# Patient Record
Sex: Male | Born: 1995 | Race: Black or African American | Hispanic: No | Marital: Single | Smoking: Never smoker
Health system: Southern US, Community
[De-identification: ages and names within clinical notes are randomized; demographics above are authoritative.]

## PROBLEM LIST (undated history)

## (undated) DIAGNOSIS — S82899A Other fracture of unspecified lower leg, initial encounter for closed fracture: Secondary | ICD-10-CM

## (undated) DIAGNOSIS — M25561 Pain in right knee: Secondary | ICD-10-CM

## (undated) HISTORY — PX: APPENDECTOMY: SHX54

---
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
## 2014-01-11 ENCOUNTER — Ambulatory Visit (HOSPITAL_COMMUNITY)
Admission: RE | Admit: 2014-01-11 | Discharge: 2014-01-11 | Disposition: A | Discharge status: Home / Self Care | Payer: Medicaid Other | Attending: Psychiatry | Admitting: Psychiatry

## 2014-01-11 ENCOUNTER — Encounter (HOSPITAL_COMMUNITY): Payer: Self-pay | Admitting: Emergency Medicine

## 2014-01-11 ENCOUNTER — Emergency Department (HOSPITAL_COMMUNITY)
Admission: EM | Admit: 2014-01-11 | Discharge: 2014-01-13 | Disposition: A | Discharge status: Home / Self Care | Payer: Medicaid Other | Attending: Emergency Medicine | Admitting: Emergency Medicine

## 2014-01-11 DIAGNOSIS — R45851 Suicidal ideations: Secondary | ICD-10-CM | POA: Diagnosis present

## 2014-01-11 DIAGNOSIS — F1210 Cannabis abuse, uncomplicated: Secondary | ICD-10-CM | POA: Insufficient documentation

## 2014-01-11 DIAGNOSIS — F329 Major depressive disorder, single episode, unspecified: Secondary | ICD-10-CM | POA: Insufficient documentation

## 2014-01-11 DIAGNOSIS — Z8739 Personal history of other diseases of the musculoskeletal system and connective tissue: Secondary | ICD-10-CM | POA: Diagnosis not present

## 2014-01-11 DIAGNOSIS — J45909 Unspecified asthma, uncomplicated: Secondary | ICD-10-CM | POA: Diagnosis not present

## 2014-01-11 DIAGNOSIS — Z87828 Personal history of other (healed) physical injury and trauma: Secondary | ICD-10-CM | POA: Diagnosis not present

## 2014-01-11 DIAGNOSIS — F39 Unspecified mood [affective] disorder: Secondary | ICD-10-CM

## 2014-01-11 DIAGNOSIS — F32A Depression, unspecified: Secondary | ICD-10-CM

## 2014-01-11 DIAGNOSIS — E049 Nontoxic goiter, unspecified: Secondary | ICD-10-CM | POA: Diagnosis not present

## 2014-01-11 LAB — SALICYLATE LEVEL: Salicylate Lvl: <2 mg/dL — ABNORMAL LOW (ref 2.8–20.0)

## 2014-01-11 LAB — COMPREHENSIVE METABOLIC PANEL
ALT: 20 U/L (ref 0–53)
AST: 29 U/L (ref 0–37)
Albumin: 4.6 g/dL (ref 3.5–5.2)
Alkaline Phosphatase: 85 U/L (ref 39–117)
Anion gap: 15 (ref 5–15)
BILIRUBIN TOTAL: 0.6 mg/dL (ref 0.3–1.2)
BUN: 18 mg/dL (ref 6–23)
CALCIUM: 10.1 mg/dL (ref 8.4–10.5)
CHLORIDE: 107 meq/L (ref 96–112)
CO2: 22 meq/L (ref 19–32)
Creatinine, Ser: 1.03 mg/dL (ref 0.50–1.35)
GFR calc Af Amer: >90 mL/min (ref >90)
Glucose, Bld: 81 mg/dL (ref 70–99)
Potassium: 4.3 mEq/L (ref 3.7–5.3)
SODIUM: 144 meq/L (ref 137–147)
Total Protein: 7.9 g/dL (ref 6.0–8.3)

## 2014-01-11 LAB — CBC WITH DIFFERENTIAL/PLATELET
BASOS ABS: 0 10*3/uL (ref 0.0–0.1)
Basophils Relative: 1 % (ref 0–1)
Eosinophils Absolute: 0.3 10*3/uL (ref 0.0–0.7)
Eosinophils Relative: 5 % (ref 0–5)
HCT: 49.4 % (ref 39.0–52.0)
Hemoglobin: 16.3 g/dL (ref 13.0–17.0)
LYMPHS PCT: 28 % (ref 12–46)
Lymphs Abs: 1.7 10*3/uL (ref 0.7–4.0)
MCH: 28.1 pg (ref 26.0–34.0)
MCHC: 33 g/dL (ref 30.0–36.0)
MCV: 85.2 fL (ref 78.0–100.0)
Monocytes Absolute: 0.6 10*3/uL (ref 0.1–1.0)
Monocytes Relative: 9 % (ref 3–12)
NEUTROS ABS: 3.5 10*3/uL (ref 1.7–7.7)
NEUTROS PCT: 57 % (ref 43–77)
PLATELETS: 201 10*3/uL (ref 150–400)
RBC: 5.8 MIL/uL (ref 4.22–5.81)
RDW: 13.1 % (ref 11.5–15.5)
WBC: 6.2 10*3/uL (ref 4.0–10.5)

## 2014-01-11 LAB — ACETAMINOPHEN LEVEL

## 2014-01-11 LAB — RAPID URINE DRUG SCREEN, HOSP PERFORMED
Amphetamines: NOT DETECTED
BARBITURATES: NOT DETECTED
Benzodiazepines: NOT DETECTED
COCAINE: NOT DETECTED
Opiates: NOT DETECTED
TETRAHYDROCANNABINOL: POSITIVE — AB

## 2014-01-11 LAB — ETHANOL: Alcohol, Ethyl (B): <11 mg/dL (ref 0–11)

## 2014-01-11 MED ORDER — IBUPROFEN 200 MG PO TABS
600.0000 mg | ORAL_TABLET | Freq: Three times a day (TID) | ORAL | Status: DC | PRN
  Administered 2014-01-11 – 2014-01-12 (×2): 600 mg via ORAL
  Filled 2014-01-11 (×2): qty 3

## 2014-01-11 MED ORDER — ZOLPIDEM TARTRATE 5 MG PO TABS: 5.0000 mg | ORAL_TABLET | Freq: Every evening | ORAL | Status: DC | PRN

## 2014-01-11 MED ORDER — ONDANSETRON HCL 4 MG PO TABS: 4.0000 mg | ORAL_TABLET | Freq: Three times a day (TID) | ORAL | Status: DC | PRN

## 2014-01-11 MED ORDER — ALUM & MAG HYDROXIDE-SIMETH 200-200-20 MG/5ML PO SUSP: 30.0000 mL | ORAL | Status: DC | PRN

## 2014-01-11 MED ORDER — ACETAMINOPHEN 325 MG PO TABS: 650.0000 mg | ORAL_TABLET | ORAL | Status: DC | PRN

## 2014-01-11 NOTE — BH Assessment (Signed)
Assessment Note  Peter Solomon is an 18 y.o. African American male that reports hearing voices telling him to kill himself by cutting his wrist or by doing something stupid so the police will kill him.  Patient reports that the voices are telling him to harm others as well.  Patient does not have a plan to harm others.  Patient reports that he first started hearing voices when he was in middle school.  Patient reports that he stopped taking his medication 2 years ago.  Patient reports that he only gets 2-3 hours of sleep at night fir the past 2 months.    Patient reports that he lives with his mother and has positive family supports.  Patient reports that he attends [employer] but he was suspended for 5 days due to instigating a fight at school.  Patient denies prior psychiatric hospitalization.  Patient denies physical, sexual or emotional abuse.  Patient denies substance abuse.      Axis I: Mood Disorder NOS Axis II: Deferred Axis III:  Past Medical History  Diagnosis Date  . Asthma   . Broken ankle     broken right ankle, 18yo  . Right knee pain 12/06/2011   Axis IV: economic problems, occupational problems, other psychosocial or environmental problems, problems related to social environment and problems with primary support group Axis V: 21-30 behavior considerably influenced by delusions or hallucinations OR serious impairment in judgment, communication OR inability to function in almost all areas  Past Medical History:  Past Medical History  Diagnosis Date  . Asthma   . Broken ankle     broken right ankle, 18yo  . Right knee pain 12/06/2011    Past Surgical History  Procedure Laterality Date  . Laparoscopic appendectomy N/A 05/27/2012    Procedure: APPENDECTOMY LAPAROSCOPIC;  Surgeon: Lodema PilotBrian Layton, DO;  Location: [facility];  Service: General;  Laterality: N/A;    Family History:  Family History  Problem Relation Age of Onset  . Diabetes Other   . Hypertension  Other   . Cancer Other   . Diabetes Father   . Hypertension Father   . Seizures Father     Social History:  reports that he has never smoked. He has never used smokeless tobacco. He reports that he uses illicit drugs (Marijuana) about 4 times per week. He reports that he does not drink alcohol.  Additional Social History:     CIWA:   COWS:    Allergies: No Known Allergies  Home Medications:  (Not in a hospital admission)  OB/GYN Status:  No LMP for male patient.  General Assessment Data [facility] of Assessment[facility] Is this a Tele or Face-to-Face Assessment?: Face-to-Face Is this an Initial Assessment or a Re-assessment for this encounter?: Initial Assessment Living Arrangements: Parent Can pt return to current living arrangement?: Yes Admission Status: Voluntary Is patient capable of signing voluntary admission?: Yes Transfer from: Home Referral Source: Self/Family/Friend  Medical Screening Exam [facility] Walk-[facility] ONLY) Medical Exam completed: Yes  [facility] Living Arrangements: Parent Name of Psychiatrist: None Reported Name of Therapist: None Reported  Education Status Is patient currently in school?: No Current Grade: NA Highest grade of school patient has completed: NA Name of school: NA Contact person: NA  Risk to self with the past 6 months Suicidal Ideation: Yes-Currently Present Suicidal Intent: Yes-Currently Present Is patient at risk for suicide?: Yes Suicidal Plan?: Yes-Currently Present Specify Current Suicidal Plan: Cut his  wrist or do something stupid to cause the police to kill him. Access to Means: Yes Specify Access to Suicidal Means: Anything sharp What has been your use of drugs/alcohol within the last 12 months?: None Reported Previous Attempts/Gestures: No How many times?: 0 Other Self Harm Risks: None Reported Triggers for Past Attempts: Unpredictable, Other (Comment) (Hearing voices telling him  to hurt himself ) Intentional Self Injurious Behavior: None Family Suicide History: No Recent stressful life event(s): Job Loss, Financial Problems Persecutory voices/beliefs?: Yes Depression: Yes Depression Symptoms: Despondent, Insomnia, Tearfulness, Isolating, Fatigue, Guilt, Loss of interest in usual pleasures, Feeling worthless/self pity, Feeling angry/irritable Substance abuse history and/or treatment for substance abuse?: No Suicide prevention information given to non-admitted patients: Yes  Risk to Others within the past 6 months Homicidal Ideation: Yes-Currently Present Thoughts of Harm to Others: Yes-Currently Present Comment - Thoughts of Harm to Others: Pt reports that he is angry and wants to hurt other (Pt deinies having a specific person that he wants to harm.) Current Homicidal Intent: Yes-Currently Present Current Homicidal Plan: Yes-Currently Present Describe Current Homicidal Plan: Death by suicide Access to Homicidal Means: Yes Describe Access to Homicidal Means: Causing the police to shot him. Identified Victim: None Reported History of harm to others?: No Assessment of Violence: None Noted Violent Behavior Description: None Reported Does patient have access to weapons?: No Criminal Charges Pending?: No Does patient have a court date: No  Psychosis Hallucinations: None noted Delusions: None noted  Mental Status Report Appear/Hygiene: Disheveled Eye Contact: Fair Motor Activity: Freedom of movement Speech: Logical/coherent Level of Consciousness: Alert Mood: Depressed, Anxious Affect: Anxious, Depressed, Blunted Anxiety Level: Moderate Thought Processes: Coherent, Relevant Judgement: Unimpaired Orientation: Person, Place, Time, Situation Obsessive Compulsive Thoughts/Behaviors: None  Cognitive Functioning Concentration: Decreased Memory: Recent Intact, Remote Intact IQ: Average Insight: Poor Impulse Control: Fair Appetite: Fair Weight Loss:  0 Weight Gain: 0 Sleep: Decreased Total Hours of Sleep: 3 Vegetative Symptoms: Decreased grooming  ADLScreening [facility]) Patient's cognitive ability adequate to safely complete daily activities?: Yes Patient able to express need for assistance with ADLs?: Yes Independently performs ADLs?: Yes (appropriate for developmental age)  Prior Inpatient Therapy Prior Inpatient Therapy: No Prior Therapy Dates: NA Prior Therapy Facilty/Provider(s): NA Reason for Treatment: NA  Prior Outpatient Therapy Prior Outpatient Therapy: Yes Prior Therapy Dates: 2013    Prior Therapy Facilty/Provider(s): Pt does not remember the names of the facilities Reason for Treatment: Medication Management  ADL Screening (condition at time of admission) Patient's cognitive ability adequate to safely complete daily activities?: Yes Patient able to express need for assistance with ADLs?: Yes Independently performs ADLs?: Yes (appropriate for developmental age)                  Additional Information 1:1 In Past 12 Months?: No CIRT Risk: No Elopement Risk: No Does patient have medical clearance?: No     Disposition:  Disposition Initial Assessment Completed for this Encounter: Yes Disposition of Patient: Inpatient treatment program Type of inpatient treatment program: Adult (Per Lloyd HugerNeil patient meets criteria for inpatient hospitalizatio)  On Site Evaluation by:   Reviewed with Physician:    Phillip HealStevenson, Monisha Siebel [place] 01/11/2014 1:33 PM

## 2014-01-11 NOTE — ED Notes (Signed)
Pt transferred from E.R to SAPU. Room 42. C/o auditory hall and feeling severely depressed.Reports voices telling him that he's no good.Pt has contracted for safety.

## 2014-01-11 NOTE — ED Notes (Signed)
Pt and belongings have been wanded, belongings at nursing station and will be handed off to secured hold staff when moved. Belongings include a pair of black slippers with fur like insole, and blue sweat shorts, as well as a  Marketing executiveBlack t-shirt.

## 2014-01-11 NOTE — ED Notes (Signed)
PA at bedside.

## 2014-01-11 NOTE — ED Notes (Signed)
Pt states he has had problems with depression for much of his life, this morning he thought about killing himself by slitting his wrists with a knife. Pt denies ever attempting suicide.

## 2014-01-11 NOTE — ED Notes (Addendum)
Report received from Peter Nancyonna Perez RN. Pt. Alert and oriented in no distress denies SI, HI, VH and pain. Pt. Says he is hearing voices that are telling him to stay at home. Will continue to monitor for safety. Pt. Instructed to come to me with problems or concerns. Q 15 minute checks continue.

## 2014-01-11 NOTE — BH Assessment (Signed)
Per Lloyd HugerNeil, [place] patient meets criteria for inpatient criteria.  [facility] In[facility] Fermoina[facility]  CSW Tyler AasDoris will refer patient to other facilities.  Writer informed the Press photographerCharge Nurse Misty Stanley(Stacey) and the TTS Therapist [facility]

## 2014-01-11 NOTE — ED Notes (Signed)
Bed: [facility] Expected date:  Expected time:  Means of arrival:  Comments: [facility]-Donaghue, SI

## 2014-01-11 NOTE — ED Provider Notes (Signed)
CSN: [id number]     Arrival date & time 01/11/14  1135 History   First MD Initiated Contact with Patient 01/11/14 1149     No chief complaint on file.    (Consider location/radiation/quality/duration/timing/severity/associated sxs/prior Treatment) HPI   18 year old male presents to ED requesting for medical clearance for suicidal ideation. Patient states he has a known family history of psychiatric disease and has been having depression and suicidal ideation at an early age. For the past 6 months he has been having increased depression, hearing voices telling him to harm himself and has persistent thought of killing himself. He also reported having plans such as cutting his wrist however he would like to seek for help with his psychiatric disease.  Pt requesting for help.  Patient states he has intermittent thought of homicidal ideation but without a specific plan or specific person. He denies self medicating with either alcohol or drugs. He is a nonsmoker. He denies any recent changes in environment or medication. He does not have a primary care doctor and does not take any psychiatric medication. He endorses a chronic headache. He denies having chest pain shortness of breath abdominal pain back pain numbness or weakness.patient lives with his mom, and states that both mom and sister has psychiatric disease.  Past Medical History  Diagnosis Date  . Asthma   . Broken ankle     broken right ankle, 18yo  . Right knee pain 12/06/2011   Past Surgical History  Procedure Laterality Date  . Laparoscopic appendectomy N/A 05/27/2012    Procedure: APPENDECTOMY LAPAROSCOPIC;  Surgeon: Lodema PilotBrian Layton, DO;  Location: [facility];  Service: General;  Laterality: N/A;   Family History  Problem Relation Age of Onset  . Diabetes Other   . Hypertension Other   . Cancer Other   . Diabetes Father   . Hypertension Father   . Seizures Father    History  Substance Use Topics  . Smoking status: Never Smoker   .  Smokeless tobacco: Never Used  . Alcohol Use: No    Review of Systems  All other systems reviewed and are negative.     Allergies  Review of patient's allergies indicates no known allergies.  Home Medications   Prior to Admission medications   Medication Sig Start Date End Date Taking? Authorizing Provider  albuterol (PROVENTIL HFA;VENTOLIN HFA) 108 (90 BASE) MCG/ACT inhaler Inhale 2 puffs into the lungs every 6 (six) hours as needed. For wheezing    Historical Provider, MD  Fluticasone-Salmeterol (ADVAIR) 250-50 MCG/DOSE AEPB Inhale 1 puff into the lungs every 12 (twelve) hours.    Historical Provider, MD  HYDROcodone-acetaminophen (NORCO/VICODIN) 5-325 MG per tablet Take 1-2 tablets by mouth every 4 (four) hours as needed for pain. 05/28/12   Claud KelpHaywood Ingram, MD   There were no vitals taken for this visit. Physical Exam  Constitutional: He is oriented to person, place, and time. He appears well-developed and well-nourished. No distress.  HENT:  Head: Atraumatic.  Eyes: Conjunctivae are normal.  Neck: Normal range of motion. Neck supple.  Goiter noted  Cardiovascular: Normal rate and regular rhythm.  Exam reveals no gallop and no friction rub.   No murmur heard. Pulmonary/Chest: Effort normal and breath sounds normal. No respiratory distress. He exhibits no tenderness.  Abdominal: Soft. There is no tenderness.  Neurological: He is alert and oriented to person, place, and time. GCS eye subscore is 4. GCS verbal subscore is 5. GCS motor subscore is 6.  Skin: No rash noted.  Psychiatric: He has a normal mood and affect. His speech is normal and behavior is normal. Thought content is not paranoid. He expresses suicidal ideation. He expresses no homicidal ideation.    ED Course  Procedures (including critical care time)  12:04 PM Patient reported long-standing depression and persistent suicidal ideation, currently requesting for psychiatric help. Patient is in no acute distress at  this time. Workup initiated.  2:14 PM Pt is medically cleared.  Psych hold order placed.  [facility] does not appear to have room for him at this time.  Will consult TTS.    Labs Review Labs Reviewed  URINE RAPID DRUG SCREEN (HOSP PERFORMED) - Abnormal; Notable for the following:    Tetrahydrocannabinol POSITIVE (*)    All other components within normal limits  SALICYLATE LEVEL - Abnormal; Notable for the following:    Salicylate Lvl <2.0 (*)    All other components within normal limits  CBC WITH DIFFERENTIAL  COMPREHENSIVE METABOLIC PANEL  ETHANOL  ACETAMINOPHEN LEVEL    Imaging Review No results found.   EKG Interpretation None      MDM   Final diagnoses:  Depression  Suicidal ideation    BP 121/68 mmHg  Pulse 58  Temp(Src) 98 F (36.7 C) (Oral)  Resp 16  SpO2 99%  I have reviewed nursing notes and vital signs. I reviewed available ER/hospitalization records thought the EMR     Fayrene HelperBowie Jackqueline Aquilar, PA-C 01/11/14 1604  Mirian MoMatthew Gentry, MD 01/12/14 [phone]

## 2014-01-12 DIAGNOSIS — R45851 Suicidal ideations: Secondary | ICD-10-CM

## 2014-01-12 DIAGNOSIS — R45850 Homicidal ideations: Secondary | ICD-10-CM

## 2014-01-12 DIAGNOSIS — F39 Unspecified mood [affective] disorder: Secondary | ICD-10-CM

## 2014-01-12 MED ORDER — RISPERIDONE 1 MG PO TABS
1.0000 mg | ORAL_TABLET | Freq: Two times a day (BID) | ORAL | Status: DC
  Administered 2014-01-12 – 2014-01-13 (×3): 1 mg via ORAL
  Filled 2014-01-12 (×3): qty 1

## 2014-01-12 NOTE — Consult Note (Signed)
[facility] Face-to-Face Psychiatry Consult   Reason for Consult:  "depressed and angry" Referring Physician:  EDP  Peter Solomon is an 18 y.o. male. Total Time spent with patient: 30 minutes  Assessment: AXIS I:  Mood Disorder NOS AXIS II:  Deferred AXIS III:   Past Medical History  Diagnosis Date  . Asthma   . Broken ankle     broken right ankle, 18yo  . Right knee pain 12/06/2011   AXIS IV:  other psychosocial or environmental problems AXIS V:  21-30 behavior considerably influenced by delusions or hallucinations OR serious impairment in judgment, communication OR inability to function in almost all areas  Plan:  Recommend psychiatric Inpatient admission when medically cleared.  Subjective:   Peter Solomon is a 18 y.o. male patient admitted reporting hearing voices telling him to kill himself by cutting his wrist or by doing something stupid so the police will kill him. Patient reports that the voices are telling him to harm others as well. Patient does not have a plan to harm others. Patient reports that he first started hearing voices when he was in middle school. Patient reports that he stopped taking his medication 2 years ago. Patient reports that he only gets 2-3 hours of sleep at night for the past 2 months.His mood has been depressed and angry. No prior suicide attempts. No previous psych meds.   Patient reports that he lives with his mother and has positive family supports. Patient reports that he attends [employer] but he was suspended for 5 days due to instigating a fight at school.  Patient denies prior psychiatric hospitalization. Patient denies physical, sexual or emotional abuse. Patient denies substance abuse. Marland Kitchen  HPI:  See above HPI Elements:   Location:  severe. Quality:  depressed. Severity:  severe. Timing:  recent. Duration:  recent. Context:  severe.  Past Psychiatric History: Past Medical History  Diagnosis Date  . Asthma   . Broken  ankle     broken right ankle, 18yo  . Right knee pain 12/06/2011    reports that he has never smoked. He has never used smokeless tobacco. He reports that he uses illicit drugs (Marijuana) about 4 times per week. He reports that he does not drink alcohol. Family History  Problem Relation Age of Onset  . Diabetes Other   . Hypertension Other   . Cancer Other   . Diabetes Father   . Hypertension Father   . Seizures Father            Allergies:  No Known Allergies  ACT Assessment Complete:  Yes:    Educational Status    Risk to Self: Risk to self with the past 6 months Is patient at risk for suicide?: Yes Substance abuse history and/or treatment for substance abuse?: No  Risk to Others:    Abuse:    Prior Inpatient Therapy:    Prior Outpatient Therapy:    Additional Information:                    Objective: Blood pressure 131/63, pulse 60, temperature 97.6 F (36.4 C), temperature source Oral, resp. rate 16, SpO2 100 %.There is no height or weight on file to calculate BMI. Results for orders placed or performed during the hospital encounter of 01/11/14 (from the past 72 hour(s))  CBC with Differential     Status: None   Collection Time: 01/11/14 12:13 PM  Result Value Ref Range   WBC 6.2 4.0 -  10.5 K/uL   RBC 5.80 4.22 - 5.81 MIL/uL   Hemoglobin 16.3 13.0 - 17.0 g/dL   HCT 49.4 39.0 - 52.0 %   MCV 85.2 78.0 - 100.0 fL   MCH 28.1 26.0 - 34.0 pg   MCHC 33.0 30.0 - 36.0 g/dL   RDW 13.1 11.5 - 15.5 %   Platelets 201 150 - 400 K/uL   Neutrophils Relative % 57 43 - 77 %   Neutro Abs 3.5 1.7 - 7.7 K/uL   Lymphocytes Relative 28 12 - 46 %   Lymphs Abs 1.7 0.7 - 4.0 K/uL   Monocytes Relative 9 3 - 12 %   Monocytes Absolute 0.6 0.1 - 1.0 K/uL   Eosinophils Relative 5 0 - 5 %   Eosinophils Absolute 0.3 0.0 - 0.7 K/uL   Basophils Relative 1 0 - 1 %   Basophils Absolute 0.0 0.0 - 0.1 K/uL  Comprehensive metabolic panel     Status: None   Collection Time:  01/11/14 12:13 PM  Result Value Ref Range   Sodium 144 137 - 147 mEq/L   Potassium 4.3 3.7 - 5.3 mEq/L   Chloride 107 96 - 112 mEq/L   CO2 22 19 - 32 mEq/L   Glucose, Bld 81 70 - 99 mg/dL   BUN 18 6 - 23 mg/dL   Creatinine, Ser 1.03 0.50 - 1.35 mg/dL   Calcium 10.1 8.4 - 10.5 mg/dL   Total Protein 7.9 6.0 - 8.3 g/dL   Albumin 4.6 3.5 - 5.2 g/dL   AST 29 0 - 37 U/L   ALT 20 0 - 53 U/L   Alkaline Phosphatase 85 39 - 117 U/L   Total Bilirubin 0.6 0.3 - 1.2 mg/dL   GFR calc non Af Amer >90 >90 mL/min   GFR calc Af Amer >90 >90 mL/min    Comment: (NOTE) The eGFR has been calculated using the CKD EPI equation. This calculation has not been validated in all clinical situations. eGFR's persistently <90 mL/min signify possible Chronic Kidney Disease.    Anion gap 15 5 - 15  Ethanol     Status: None   Collection Time: 01/11/14 12:13 PM  Result Value Ref Range   Alcohol, Ethyl (B) <11 0 - 11 mg/dL    Comment:        LOWEST DETECTABLE LIMIT FOR SERUM ALCOHOL IS 11 mg/dL FOR MEDICAL PURPOSES ONLY   Salicylate level     Status: Abnormal   Collection Time: 01/11/14 12:13 PM  Result Value Ref Range   Salicylate Lvl <7.0 (L) 2.8 - 20.0 mg/dL  Acetaminophen level     Status: None   Collection Time: 01/11/14 12:13 PM  Result Value Ref Range   Acetaminophen (Tylenol), Serum <15.0 10 - 30 ug/mL    Comment:        THERAPEUTIC CONCENTRATIONS VARY SIGNIFICANTLY. A RANGE OF 10-30 ug/mL MAY BE AN EFFECTIVE CONCENTRATION FOR MANY PATIENTS. HOWEVER, SOME ARE BEST TREATED AT CONCENTRATIONS OUTSIDE THIS RANGE. ACETAMINOPHEN CONCENTRATIONS >150 ug/mL AT 4 HOURS AFTER INGESTION AND >50 ug/mL AT 12 HOURS AFTER INGESTION ARE OFTEN ASSOCIATED WITH TOXIC REACTIONS.   Urine rapid drug screen (hosp performed)     Status: Abnormal   Collection Time: 01/11/14 12:57 PM  Result Value Ref Range   Opiates NONE DETECTED NONE DETECTED   Cocaine NONE DETECTED NONE DETECTED   Benzodiazepines NONE  DETECTED NONE DETECTED   Amphetamines NONE DETECTED NONE DETECTED   Tetrahydrocannabinol POSITIVE (A) NONE DETECTED  Barbiturates NONE DETECTED NONE DETECTED    Comment:        DRUG SCREEN FOR MEDICAL PURPOSES ONLY.  IF CONFIRMATION IS NEEDED FOR ANY PURPOSE, NOTIFY LAB WITHIN 5 DAYS.        LOWEST DETECTABLE LIMITS FOR URINE DRUG SCREEN Drug Class       Cutoff (ng/mL) Amphetamine      1000 Barbiturate      200 Benzodiazepine   540 Tricyclics       981 Opiates          300 Cocaine          300 THC              50    Labs are reviewed and are pertinent for THC positive.  Current Facility-Administered Medications  Medication Dose Route Frequency Provider Last Rate Last Dose  . acetaminophen (TYLENOL) tablet 650 mg  650 mg Oral Q4H PRN Domenic Moras, PA-C      . alum & mag hydroxide-simeth (MAALOX/MYLANTA) 200-200-20 MG/5ML suspension 30 mL  30 mL Oral PRN Domenic Moras, PA-C      . ibuprofen (ADVIL,MOTRIN) tablet 600 mg  600 mg Oral Q8H PRN Domenic Moras, PA-C   600 mg at 01/11/14 2216  . ondansetron (ZOFRAN) tablet 4 mg  4 mg Oral Q8H PRN Domenic Moras, PA-C      . zolpidem (AMBIEN) tablet 5 mg  5 mg Oral QHS PRN Domenic Moras, PA-C       Current Outpatient Prescriptions  Medication Sig Dispense Refill  . albuterol (PROVENTIL HFA;VENTOLIN HFA) 108 (90 BASE) MCG/ACT inhaler Inhale 2 puffs into the lungs every 6 (six) hours as needed. For wheezing    . albuterol (PROVENTIL) (2.5 MG/3ML) 0.083% nebulizer solution Take 5 mg by nebulization every 6 (six) hours as needed for wheezing or shortness of breath.    Marland Kitchen ibuprofen (ADVIL,MOTRIN) 200 MG tablet Take 200 mg by mouth every 6 (six) hours as needed.    . Fluticasone-Salmeterol (ADVAIR) 250-50 MCG/DOSE AEPB Inhale 1 puff into the lungs every 12 (twelve) hours.    Marland Kitchen HYDROcodone-acetaminophen (NORCO/VICODIN) 5-325 MG per tablet Take 1-2 tablets by mouth every 4 (four) hours as needed for pain. 30 tablet 1    Psychiatric Specialty Exam: Physical  Exam  ROS  Blood pressure 131/63, pulse 60, temperature 97.6 F (36.4 C), temperature source Oral, resp. rate 16, SpO2 100 %.There is no height or weight on file to calculate BMI.  General Appearance: Disheveled and Guarded  Eye Contact::  Minimal  Speech:  Slow  Volume:  Decreased  Mood:  Depressed  Affect:  Depressed  Thought Process:  Goal Directed  Orientation:  Full (Time, Place, and Person)  Thought Content:  Hallucinations: Auditory Command:  to kill self by cutting  Suicidal Thoughts:  Yes.  with intent/plan  Homicidal Thoughts:  Yes.  without intent/plan  Memory:  Negative  Judgement:  Poor  Insight:  Shallow  Psychomotor Activity:  Decreased  Concentration:  Poor  Recall:  Poor  Fund of Knowledge:Fair  Language: Fair  Akathisia:  Negative  Handed:  Right  AIMS (if indicated):     Assets:  Desire for Improvement Physical Health Social Support  Sleep:      Musculoskeletal: Strength & Muscle Tone: within normal limits Gait & Station: normal Patient leans: N/A  Treatment Plan Summary: Daily contact with patient to assess and evaluate symptoms and progress in treatment Medication management Pursue inpatient bed for safety/stabilization.  Dereck Leep  01/12/2014 1:17 PM

## 2014-01-12 NOTE — ED Notes (Signed)
Up to the bathroom 

## 2014-01-12 NOTE — ED Notes (Signed)
In activity room watching tv, c/o of HA.  Pt reports that the voices are gone (has a ha w/ the voices).  Pt encouraged to return to room and dim tthe lights, medicated.  Pt reports some relief w/ decreased lighting.

## 2014-01-12 NOTE — ED Notes (Signed)
Up on the phone 

## 2014-01-12 NOTE — ED Notes (Signed)
Dr sangra and jamison into see 

## 2014-01-12 NOTE — ED Notes (Signed)
Report received from Janie Rambo RN. Pt. Alert and oriented in no distress denies SI, HI, AVH and pain. Will continue to monitor for safety. Pt. Instructed to come to me with problems or concerns. Q 15 minute checks continue. 

## 2014-01-12 NOTE — BH Assessment (Signed)
Binnie RailJoann Glover, [facility], adult unit is at capacity. Contacted the following facilities for placement:  BED AVAILABLE, PT IS UNDER REVIEW: [facility], per [facility], per [facility], per [facility], per [facility]  AT CAPACITY: [facility], per [facility], per Earl LitesJennifer [facility], per [facility], per [facility], per [facility], per [facility], per [facility], per [facility], per [facility], per [facility] [place], per [facility], per Leticia PennaLarry [facility], per [facility], per [facility], per Aggie Cosierarl  NO RESPONSE: [facility]   [address]Detrice Cales Ellis Patsy BaltimoreWarrick Jr, WisconsinLPC, [facility] Triage Specialist [phone]

## 2014-01-13 NOTE — ED Notes (Signed)
Contact information given to pt for his mother.  Pt is aware that he will be transfering today

## 2014-01-13 NOTE — ED Notes (Signed)
Up to the bathroom 

## 2014-01-13 NOTE — ED Notes (Addendum)
Pt ambulatory w/o difficulty w/  sheriff [facility] to [facility].  Belongings given to sheriff.   MAR, transfer report, face sheet, assessment notes, EMTELA, and IVC papers sent w/ pt.  Pt encouraged to contact his mother when he arrives.  Will notify [facility] that pt has been transported

## 2014-01-13 NOTE — BH Assessment (Signed)
[facility]  Per Tammy, [facility]Kean [facility] rm # [id number].  Call report # is [phone].  Pt will be transported by law enforcement after pt is IVC'd by Dr. Tawni CarnesSaranga.

## 2014-01-13 NOTE — ED Notes (Signed)
RN unable to take report and  will call back

## 2014-01-13 NOTE — Progress Notes (Signed)
3:00pm. CSW faxed [facility] of [facility].

## 2014-01-13 NOTE — ED Notes (Signed)
Message left for sheriff for transport 

## 2014-02-13 ENCOUNTER — Encounter (HOSPITAL_COMMUNITY): Payer: Self-pay

## 2014-02-13 ENCOUNTER — Emergency Department (HOSPITAL_COMMUNITY)
Admission: EM | Admit: 2014-02-13 | Discharge: 2014-02-13 | Discharge status: Against Medical Advice | Payer: Medicaid Other | Attending: Emergency Medicine | Admitting: Emergency Medicine

## 2014-02-13 DIAGNOSIS — R1110 Vomiting, unspecified: Secondary | ICD-10-CM | POA: Insufficient documentation

## 2014-02-13 DIAGNOSIS — R197 Diarrhea, unspecified: Secondary | ICD-10-CM | POA: Diagnosis not present

## 2014-02-13 DIAGNOSIS — G43909 Migraine, unspecified, not intractable, without status migrainosus: Secondary | ICD-10-CM | POA: Insufficient documentation

## 2014-02-13 LAB — CBC WITH DIFFERENTIAL/PLATELET
BASOS ABS: 0 10*3/uL (ref 0.0–0.1)
Basophils Relative: 1 % (ref 0–1)
EOS PCT: 6 % — AB (ref 0–5)
Eosinophils Absolute: 0.2 10*3/uL (ref 0.0–0.7)
HCT: 42.6 % (ref 39.0–52.0)
Hemoglobin: 14 g/dL (ref 13.0–17.0)
LYMPHS PCT: 44 % (ref 12–46)
Lymphs Abs: 1.9 10*3/uL (ref 0.7–4.0)
MCH: 27.8 pg (ref 26.0–34.0)
MCHC: 32.9 g/dL (ref 30.0–36.0)
MCV: 84.5 fL (ref 78.0–100.0)
Monocytes Absolute: 0.3 10*3/uL (ref 0.1–1.0)
Monocytes Relative: 8 % (ref 3–12)
NEUTROS ABS: 1.7 10*3/uL (ref 1.7–7.7)
NEUTROS PCT: 41 % — AB (ref 43–77)
PLATELETS: 211 10*3/uL (ref 150–400)
RBC: 5.04 MIL/uL (ref 4.22–5.81)
RDW: 13.1 % (ref 11.5–15.5)
WBC: 4.2 10*3/uL (ref 4.0–10.5)

## 2014-02-13 LAB — COMPREHENSIVE METABOLIC PANEL
ALK PHOS: 75 U/L (ref 39–117)
ALT: 64 U/L — ABNORMAL HIGH (ref 0–53)
AST: 32 U/L (ref 0–37)
Albumin: 4.2 g/dL (ref 3.5–5.2)
Anion gap: 13 (ref 5–15)
BUN: 13 mg/dL (ref 6–23)
CALCIUM: 9.5 mg/dL (ref 8.4–10.5)
CO2: 24 mEq/L (ref 19–32)
Chloride: 103 mEq/L (ref 96–112)
Creatinine, Ser: 1.07 mg/dL (ref 0.50–1.35)
GFR calc Af Amer: >90 mL/min (ref >90)
GFR calc non Af Amer: >90 mL/min (ref >90)
Glucose, Bld: 119 mg/dL — ABNORMAL HIGH (ref 70–99)
POTASSIUM: 4 meq/L (ref 3.7–5.3)
Sodium: 140 mEq/L (ref 137–147)
TOTAL PROTEIN: 7.2 g/dL (ref 6.0–8.3)
Total Bilirubin: 0.3 mg/dL (ref 0.3–1.2)

## 2014-02-13 NOTE — ED Notes (Signed)
Charge called for pt, second attempt, called outside

## 2014-02-13 NOTE — ED Notes (Signed)
Called from lobby but patient did not arise

## 2014-02-13 NOTE — ED Notes (Signed)
Pt presents with c/o migraine, diarrhea, and emesis. Pt reports he has been having migraines ever since he suffered a head injury approx one year ago. Pt reports the diarrhea and vomiting started approx 2 weeks ago. Pt reports he has thrown up twice today and has had diarrhea all day.

## 2014-04-22 ENCOUNTER — Encounter (HOSPITAL_COMMUNITY): Payer: Self-pay | Admitting: Adult Health

## 2014-04-22 ENCOUNTER — Emergency Department (HOSPITAL_COMMUNITY)
Admission: EM | Admit: 2014-04-22 | Discharge: 2014-04-22 | Disposition: A | Discharge status: Home / Self Care | Payer: Medicaid Other | Attending: Emergency Medicine | Admitting: Emergency Medicine

## 2014-04-22 DIAGNOSIS — Z7951 Long term (current) use of inhaled steroids: Secondary | ICD-10-CM | POA: Diagnosis not present

## 2014-04-22 DIAGNOSIS — Z8619 Personal history of other infectious and parasitic diseases: Secondary | ICD-10-CM | POA: Insufficient documentation

## 2014-04-22 DIAGNOSIS — Z202 Contact with and (suspected) exposure to infections with a predominantly sexual mode of transmission: Secondary | ICD-10-CM | POA: Insufficient documentation

## 2014-04-22 DIAGNOSIS — Z87828 Personal history of other (healed) physical injury and trauma: Secondary | ICD-10-CM | POA: Diagnosis not present

## 2014-04-22 DIAGNOSIS — J45909 Unspecified asthma, uncomplicated: Secondary | ICD-10-CM | POA: Diagnosis not present

## 2014-04-22 MED ORDER — CEFTRIAXONE SODIUM 250 MG IJ SOLR
250.0000 mg | Freq: Once | INTRAMUSCULAR | Status: AC
  Administered 2014-04-22: 250 mg via INTRAMUSCULAR
  Filled 2014-04-22: qty 250

## 2014-04-22 MED ORDER — AZITHROMYCIN 250 MG PO TABS
1000.0000 mg | ORAL_TABLET | Freq: Once | ORAL | Status: AC
  Administered 2014-04-22: 1000 mg via ORAL
  Filled 2014-04-22: qty 4

## 2014-04-22 NOTE — ED Notes (Signed)
Requesting STD check-partner has chlamydia-denies symptoms.

## 2014-04-22 NOTE — ED Provider Notes (Signed)
CSN: [id number]     Arrival date & time 04/22/14  1853 History   First MD Initiated Contact with Patient 04/22/14 1859     Chief Complaint  Patient presents with  . Exposure to STD     (Consider location/radiation/quality/duration/timing/severity/associated sxs/prior Treatment) The history is provided by the patient and medical records.     This is a 19 year old male with past medical history significant for asthma, presenting to the ED requesting STD check. Patient states he was informed earlier this morning that his male sexual partner tested positive for chlamydia. He has no current symptoms, specifically no dysuria, urinary frequency, urethral discharge, or abdominal pain. No fevers, chills, or rash. Patient has prior history of chlamydia several years ago, he was asymptomatic at that time as well.  Past Medical History  Diagnosis Date  . Asthma   . Broken ankle     broken right ankle, 19yo  . Right knee pain 12/06/2011   Past Surgical History  Procedure Laterality Date  . Laparoscopic appendectomy N/A 05/27/2012    Procedure: APPENDECTOMY LAPAROSCOPIC;  Surgeon: Lodema PilotBrian Layton, DO;  Location: [facility];  Service: General;  Laterality: N/A;   Family History  Problem Relation Age of Onset  . Diabetes Other   . Hypertension Other   . Cancer Other   . Diabetes Father   . Hypertension Father   . Seizures Father    History  Substance Use Topics  . Smoking status: Never Smoker   . Smokeless tobacco: Never Used  . Alcohol Use: No    Review of Systems  Genitourinary:       STD check  All other systems reviewed and are negative.     Allergies  Review of patient's allergies indicates no known allergies.  Home Medications   Prior to Admission medications   Medication Sig Start Date End Date Taking? Authorizing Provider  albuterol (PROVENTIL HFA;VENTOLIN HFA) 108 (90 BASE) MCG/ACT inhaler Inhale 2 puffs into the lungs every 6 (six) hours as needed. For wheezing     Historical Provider, MD  albuterol (PROVENTIL) (2.5 MG/3ML) 0.083% nebulizer solution Take 5 mg by nebulization every 6 (six) hours as needed for wheezing or shortness of breath.    Historical Provider, MD  Fluticasone-Salmeterol (ADVAIR) 250-50 MCG/DOSE AEPB Inhale 1 puff into the lungs every 12 (twelve) hours.    Historical Provider, MD  HYDROcodone-acetaminophen (NORCO/VICODIN) 5-325 MG per tablet Take 1-2 tablets by mouth every 4 (four) hours as needed for pain. 05/28/12   Claud KelpHaywood Ingram, MD  ibuprofen (ADVIL,MOTRIN) 200 MG tablet Take 200 mg by mouth every 6 (six) hours as needed.    Historical Provider, MD   BP 94/76 mmHg  Pulse 79  Temp(Src) 97.7 F (36.5 C) (Oral)  Resp 17  SpO2 100%   Physical Exam  Constitutional: He is oriented to person, place, and time. He appears well-developed and well-nourished.  HENT:  Head: Normocephalic and atraumatic.  Mouth/Throat: Oropharynx is clear and moist.  Eyes: Conjunctivae and EOM are normal. Pupils are equal, round, and reactive to light.  Neck: Normal range of motion.  Cardiovascular: Normal rate, regular rhythm and normal heart sounds.   Pulmonary/Chest: Effort normal and breath sounds normal. No respiratory distress. He has no wheezes.  Abdominal: Soft. Bowel sounds are normal. There is no tenderness. There is no guarding.  Musculoskeletal: Normal range of motion.  Neurological: He is alert and oriented to person, place, and time.  Skin: Skin is warm and dry.  Psychiatric: He has  a normal mood and affect.  Nursing note and vitals reviewed.   ED Course  Procedures (including critical care time) Labs Review Labs Reviewed  GC/CHLAMYDIA PROBE AMP ([place])    Imaging Review No results found.   EKG Interpretation None      MDM   Final diagnoses:  STD exposure   19 year old male with STD exposure. He is currently asymptomatic, vital signs are stable.  Gc/Chl swab pending.  Patient treated empirically with Rocephin and  azithromycin in the ED. He was instructed that he will be notified of 48-72 hours if culture results are positive.  He is to follow-up with his primary care physician.  Discussed plan with patient, he/she acknowledged understanding and agreed with plan of care.  Return precautions given for new or worsening symptoms.  Garlon Hatchet, PA-C 04/22/14 1927  Joya Gaskins, MD 04/22/14 [phone]

## 2014-04-22 NOTE — Discharge Instructions (Signed)
You have been treated for gonorrhea/chlamydia. Your cultures will come back in 48-72 hours.  You will be notified if positive. Follow-up with your primary care physician. Return to the ED for new concerns.

## 2014-04-24 ENCOUNTER — Telehealth (HOSPITAL_BASED_OUTPATIENT_CLINIC_OR_DEPARTMENT_OTHER): Payer: Self-pay | Admitting: Emergency Medicine

## 2014-04-24 LAB — GC/CHLAMYDIA PROBE AMP (~~LOC~~) NOT AT ARMC
Chlamydia: POSITIVE — AB
Neisseria Gonorrhea: NEGATIVE

## 2014-04-24 NOTE — Telephone Encounter (Signed)
Positive Chlamydia culture Treated with Rocephin and Zithromax per protocol MD [facility] faxed.  Attempt to contact patient by phone. # provided in epic not a working number. Letter sent.

## 2014-06-04 ENCOUNTER — Telehealth (HOSPITAL_COMMUNITY): Payer: Self-pay

## 2014-06-04 NOTE — ED Notes (Signed)
Unable to contact pt by mail or telephone. Unable to communicate lab results or treatment changes. 

## 2014-12-31 IMAGING — CT CT ABD-PELV W/ CM
1 of 2 series · 15 of 32 positions shown, 19 images · IV contrast (OMNIPAQUE 300)
Comparison: None.

CLINICAL DATA: Abdominal pain, rule out appendicitis, nausea

CT ABDOMEN AND PELVIS WITH CONTRAST
TECHNIQUE: Multidetector CT imaging of the abdomen and pelvis was
performed following the standard protocol during bolus
administration of intravenous contrast.
Contrast: 100mL OMNIPAQUE IOHEXOL 300 MG/ML  SOLN

[Series 5: abd/pel with · axial · 0.61mm/px · z∈[-371,+54]mm · 15 of 93 slices shown, 19 images]
[im 4/93  soft-tissue]
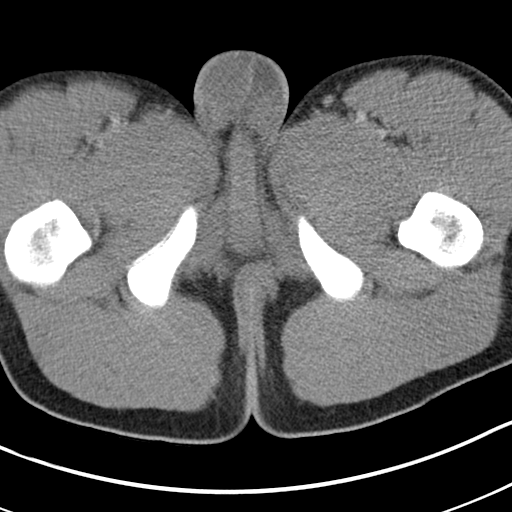
[im 4/93  bone]
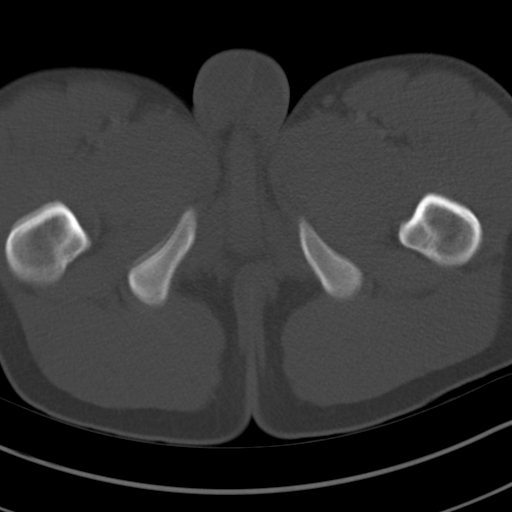
[im 11/93  soft-tissue]
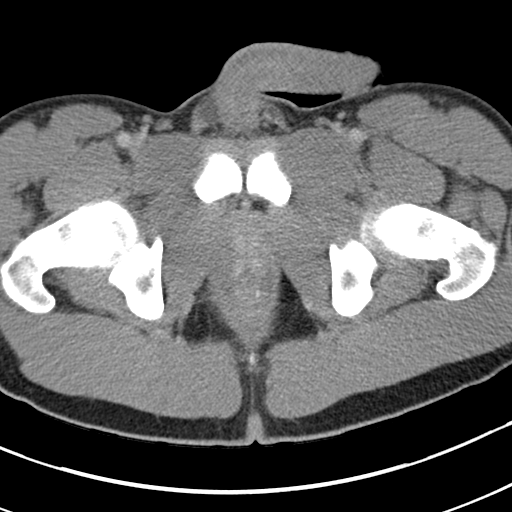
[im 18/93  soft-tissue]
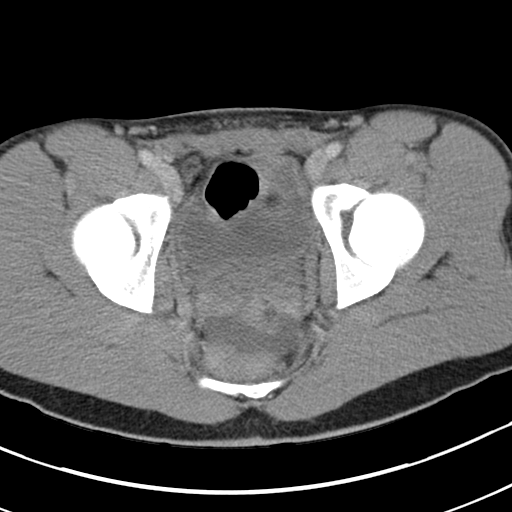
[im 25/93  soft-tissue]
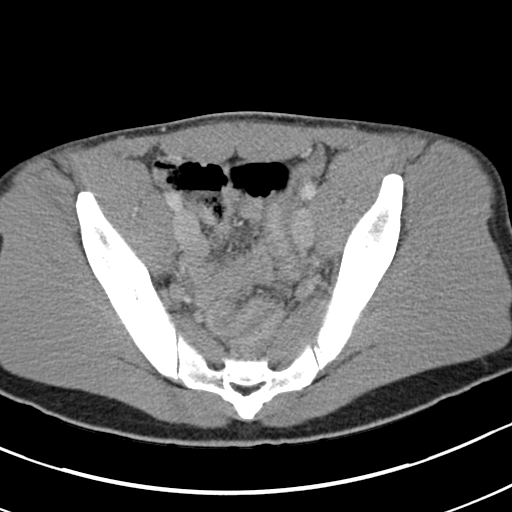
[im 32/93  soft-tissue]
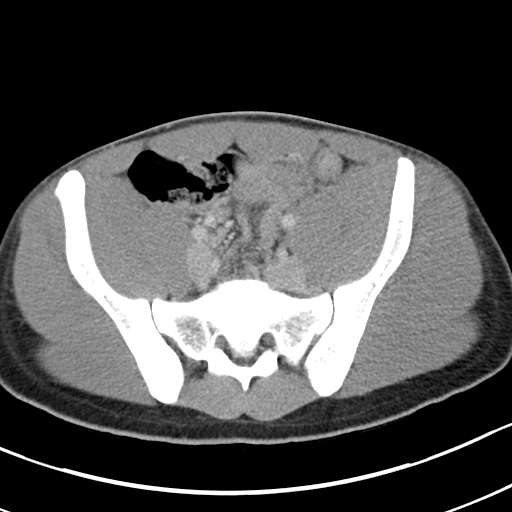
[im 39/93  soft-tissue]
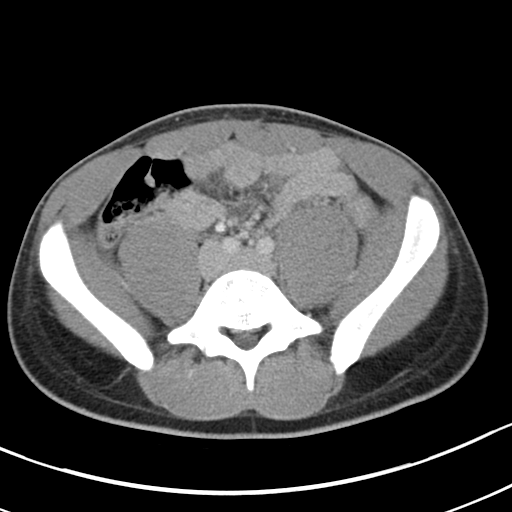
[im 47/93  soft-tissue]
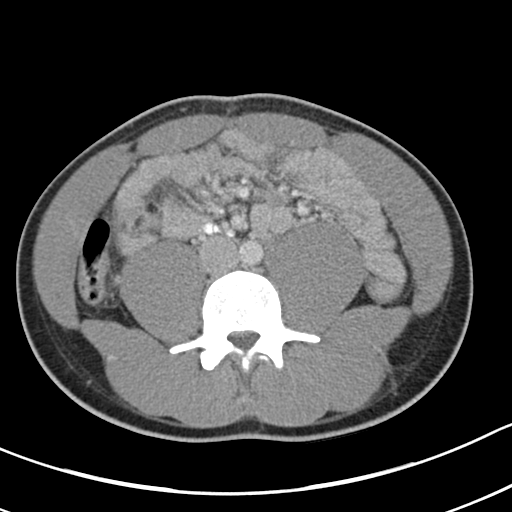
[im 54/93  soft-tissue]
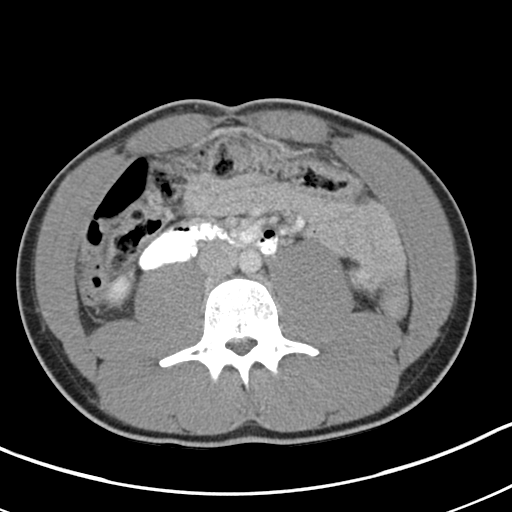
[im 61/93  soft-tissue]
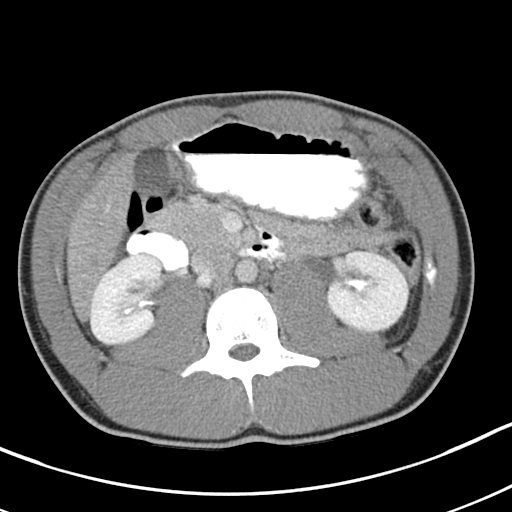
[im 61/93  bone]
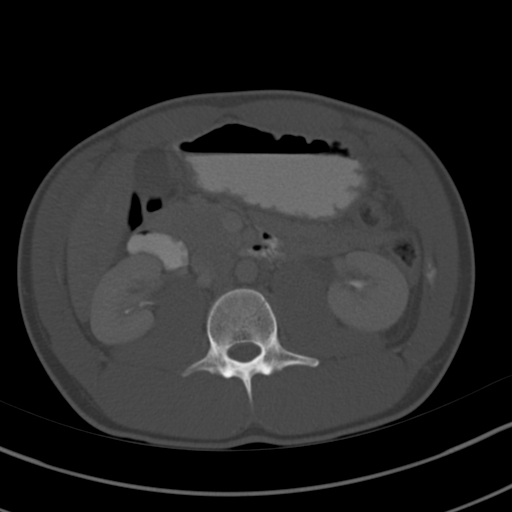
[im 68/93  soft-tissue]
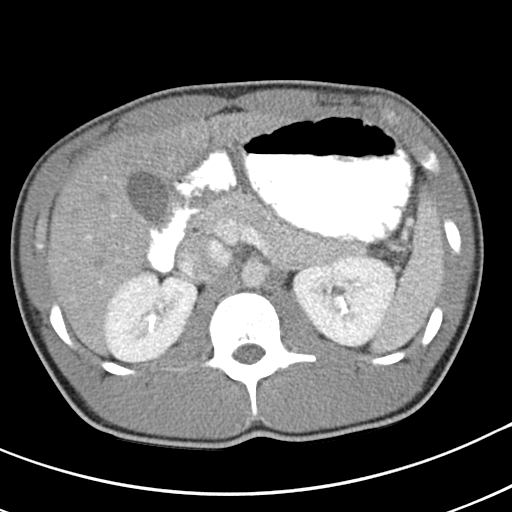
[im 75/93  soft-tissue]
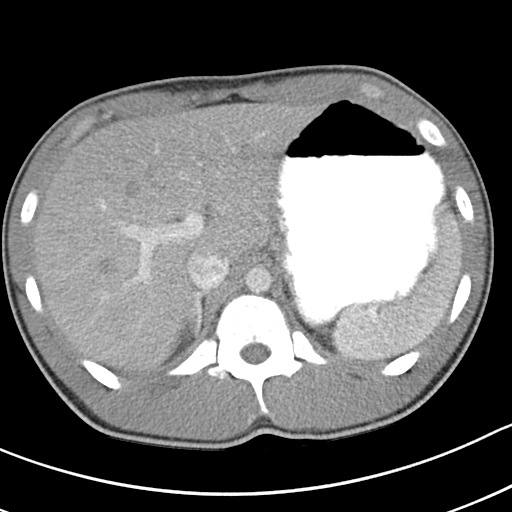
[im 78/93  lung]
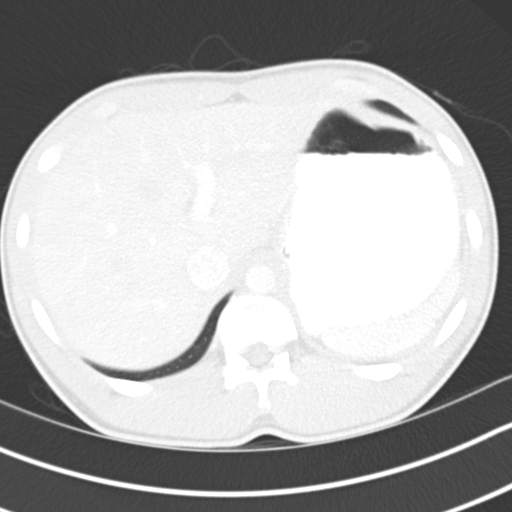
[im 82/93  soft-tissue]
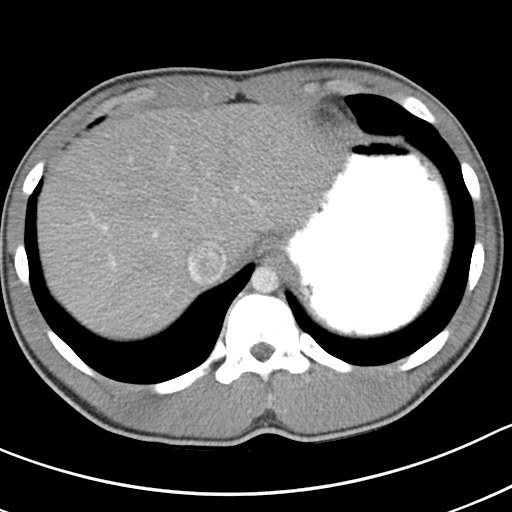
[im 82/93  lung]
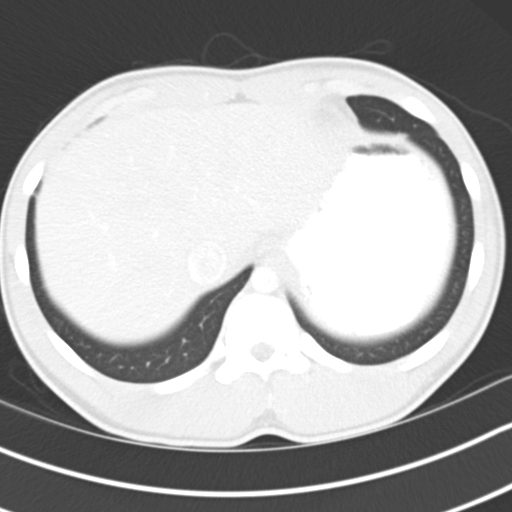
[im 85/93  lung]
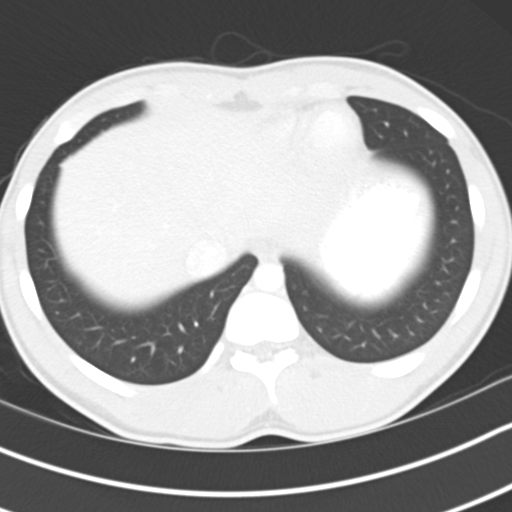
[im 89/93  soft-tissue]
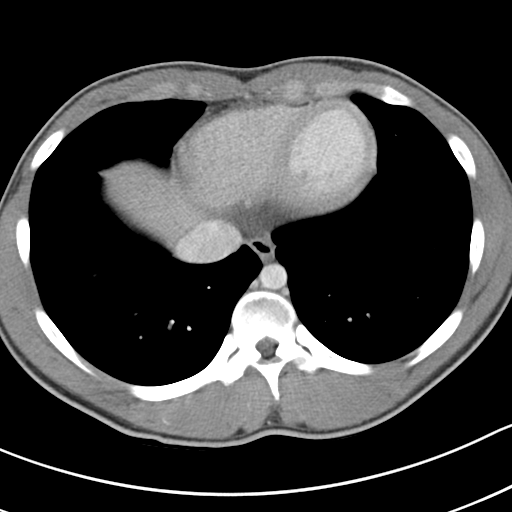
[im 89/93  lung]
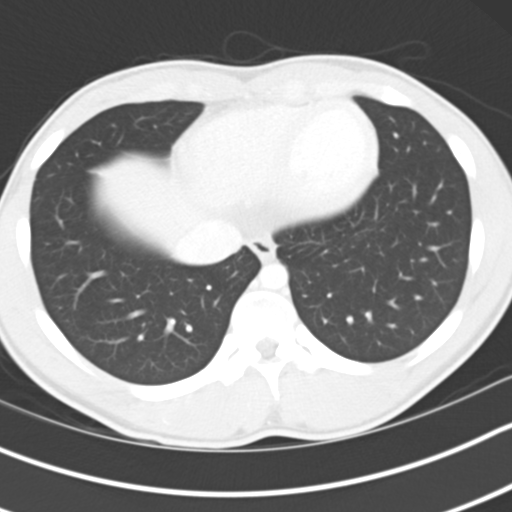

[15 of 32 positions shown; findings below may reference images not displayed]

FINDINGS: Lung bases are unremarkable.  Contrast material is noted
within stomach and proximal jejunum.  No any contrast material is
noted within distal small bowel or within the colon is markedly
limited examination.

Sagittal images of the spine are unremarkable.  Liver, spleen,
pancreas and adrenals are unremarkable.  Kidneys are symmetrical in
size and enhancement.  No focal renal mass.  No hydronephrosis or
hydroureter.  Abdominal aorta is unremarkable.  No evidence of
small bowel obstruction.  There is a low-lying cecum.  The tip of
the cecum is in the right anterior pelvis above the urinary
bladder.  Abnormal appendix with a subtle enhancement and
thickening is noted in axial image 69 measures 8 mm in diameter.
Abnormal appendix is partially visualized in the coronal image 33.
Measures about 8.5 mm in diameter.  The findings are highly
suspicious for early appendicitis.  Clinical correlation is
necessary.  Moderate pelvic free fluid noted within posterior cul-
de-sac.

No destructive bony lesions are noted within pelvis.
IMPRESSION: 1. There is a low-lying cecum.  The tip of the cecum is in the
right anterior pelvis above the urinary bladder.  Abnormal appendix
with a subtle enhancement and thickening is noted in axial image 69
measures 8 mm in diameter.  Abnormal appendix is partially
visualized in the coronal image 33.  Measures about 8.5 mm in
diameter.  The findings are highly suspicious for early
appendicitis.  Clinical correlation is necessary.
2.  No hydronephrosis or hydroureter.
3.  Moderate pelvic free fluid is noted within posterior cul-de-
sac.

Critical findings discussed with Mats Erik Merete

## 2018-09-02 ENCOUNTER — Encounter: Payer: Self-pay | Admitting: Emergency Medicine

## 2018-09-02 ENCOUNTER — Emergency Department
Admission: EM | Admit: 2018-09-02 | Discharge: 2018-09-02 | Disposition: A | Discharge status: Home / Self Care | Payer: Self-pay | Attending: Emergency Medicine | Admitting: Emergency Medicine

## 2018-09-02 ENCOUNTER — Other Ambulatory Visit: Payer: Self-pay

## 2018-09-02 DIAGNOSIS — R300 Dysuria: Secondary | ICD-10-CM | POA: Insufficient documentation

## 2018-09-02 DIAGNOSIS — A749 Chlamydial infection, unspecified: Secondary | ICD-10-CM | POA: Insufficient documentation

## 2018-09-02 DIAGNOSIS — A64 Unspecified sexually transmitted disease: Secondary | ICD-10-CM

## 2018-09-02 DIAGNOSIS — A549 Gonococcal infection, unspecified: Secondary | ICD-10-CM | POA: Insufficient documentation

## 2018-09-02 LAB — URINALYSIS, COMPLETE (UACMP) WITH MICROSCOPIC
Bacteria, UA: NONE SEEN
Bilirubin Urine: NEGATIVE
Glucose, UA: NEGATIVE mg/dL
Ketones, ur: NEGATIVE mg/dL
Nitrite: NEGATIVE
Protein, ur: NEGATIVE mg/dL
Specific Gravity, Urine: 1.02 (ref 1.005–1.030)
Squamous Epithelial / LPF: NONE SEEN (ref 0–5)
WBC, UA: >50 WBC/hpf — ABNORMAL HIGH (ref 0–5)
pH: 5 (ref 5.0–8.0)

## 2018-09-02 LAB — CHLAMYDIA/NGC RT PCR (ARMC ONLY)
Chlamydia Tr: DETECTED — AB
N gonorrhoeae: DETECTED — AB

## 2018-09-02 MED ORDER — LIDOCAINE HCL (PF) 1 % IJ SOLN
5.0000 mL | Freq: Once | INTRAMUSCULAR | Status: AC
  Administered 2018-09-02: 5 mL
  Filled 2018-09-02: qty 5

## 2018-09-02 MED ORDER — AZITHROMYCIN 500 MG PO TABS
1000.0000 mg | ORAL_TABLET | Freq: Once | ORAL | Status: AC
  Administered 2018-09-02: 1000 mg via ORAL
  Filled 2018-09-02: qty 2

## 2018-09-02 MED ORDER — CEFTRIAXONE SODIUM 250 MG IJ SOLR
250.0000 mg | Freq: Once | INTRAMUSCULAR | Status: AC
Start: 2018-09-02 — End: 2018-09-02
  Administered 2018-09-02: 250 mg via INTRAMUSCULAR
  Filled 2018-09-02: qty 250

## 2018-09-02 NOTE — ED Provider Notes (Signed)
[facility] Emergency Department Provider Note   ____________________________________________   First MD Initiated Contact with Patient 09/02/18 1028     (approximate)  I have reviewed the triage vital signs and the nursing notes.   HISTORY  Chief Complaint STD check   HPI Peter Solomon is a 23 y.o. male presents to the ED with complaint of dysuria and penile discharge.  Patient states that he had unprotected sex within the last 7 days and began having some symptoms 2 days ago which worsened this morning.  He rates his pain as an 8 out of 10.     Past Medical History:  Diagnosis Date  . Asthma   . Broken ankle    broken right ankle, 23yo  . Right knee pain 12/06/2011    Patient Active Problem List   Diagnosis Date Noted  . Mood disorder ([place]) 01/12/2014  . Broken ankle     Past Surgical History:  Procedure Laterality Date  . APPENDECTOMY    . LAPAROSCOPIC APPENDECTOMY N/A 05/27/2012   Procedure: APPENDECTOMY LAPAROSCOPIC;  Surgeon: Madilyn Hook, DO;  Location: [facility] ORS;  Service: General;  Laterality: N/A;    Prior to Admission medications   Medication Sig Start Date End Date Taking? Authorizing Provider  albuterol (PROVENTIL HFA;VENTOLIN HFA) 108 (90 BASE) MCG/ACT inhaler Inhale 2 puffs into the lungs every 6 (six) hours as needed. For wheezing    [provider]  albuterol (PROVENTIL) (2.5 MG/3ML) 0.083% nebulizer solution Take 5 mg by nebulization every 6 (six) hours as needed for wheezing or shortness of breath.    [provider]  Fluticasone-Salmeterol (ADVAIR) 250-50 MCG/DOSE AEPB Inhale 1 puff into the lungs every 12 (twelve) hours.    [provider]  ibuprofen (ADVIL,MOTRIN) 200 MG tablet Take 200 mg by mouth every 6 (six) hours as needed.    [provider]    Allergies Patient has no known allergies.  Family History  Problem Relation Age of Onset  . Diabetes Father   . Hypertension Father    . Seizures Father   . Diabetes Other   . Hypertension Other   . Cancer Other     Social History Social History   Tobacco Use  . Smoking status: Never Smoker  . Smokeless tobacco: Never Used  Substance Use Topics  . Alcohol use: No  . Drug use: Yes    Frequency: 4.0 times per week    Types: Marijuana    Review of Systems Constitutional: No fever/chills Cardiovascular: Denies chest pain. Respiratory: Denies shortness of breath. Genitourinary: Positive for dysuria.  Positive for penile discharge. Musculoskeletal: Negative for back pain. Skin: Negative for rash. Neurological: Negative for focal weakness or numbness. ____________________________________________   PHYSICAL EXAM:  VITAL SIGNS: ED Triage Vitals  Enc Vitals Group     BP 09/02/18 0945 140/70     Pulse Rate 09/02/18 0945 76     Resp 09/02/18 0945 16     Temp 09/02/18 0945 98.4 F (36.9 C)     Temp Source 09/02/18 0945 Oral     SpO2 09/02/18 0945 100 %     Weight 09/02/18 0943 185 lb (83.9 kg)     Height 09/02/18 0943 5\' 9"  (1.753 m)     Head Circumference --      Peak Flow --      Pain Score 09/02/18 0943 8     Pain Loc --      Pain Edu? --  Excl. in GC? --    Constitutional: Alert and oriented. Well appearing and in no acute distress. Eyes: Conjunctivae are normal. PERRL. EOMI. Head: Atraumatic. Neck: No stridor.   Cardiovascular: Normal rate, regular rhythm. Grossly normal heart sounds.  Good peripheral circulation. Respiratory: Normal respiratory effort.  No retractions. Lungs CTAB. Gastrointestinal: Soft and nontender. No distention.  Musculoskeletal: Moves upper lower extremities without difficulty.  Normal gait was noted. Neurologic:  Normal speech and language. No gross focal neurologic deficits are appreciated. No gait instability. Skin:  Skin is warm, dry and intact. No rash noted. Psychiatric: Mood and affect are normal. Speech and behavior are normal.   ____________________________________________   LABS (all labs ordered are listed, but only abnormal results are displayed)  Labs Reviewed  URINALYSIS, COMPLETE (UACMP) WITH MICROSCOPIC - Abnormal; Notable for the following components:      Result Value   Color, Urine YELLOW (*)    APPearance CLOUDY (*)    Hgb urine dipstick SMALL (*)    Leukocytes,Ua LARGE (*)    WBC, UA >50 (*)    All other components within normal limits  CHLAMYDIA/NGC RT PCR ([facility] ONLY)    PROCEDURES  Procedure(s) performed (including Critical Care):  Procedures   ____________________________________________   INITIAL IMPRESSION / ASSESSMENT AND PLAN / ED COURSE  As part of my medical decision making, I reviewed the following data within the electronic MEDICAL RECORD NUMBER Notes from prior ED visits and [place] Controlled Substance Database  23 year old male presents to the ED with complaint of penile discharge and dysuria for the last 2 days which is worse today.  He did have unprotected sex within the last 7 days.  Urinalysis showed greater than 50,000 WBCs and large leukocytes.  With patient's given history he was treated with Rocephin 250 mg IM and Zithromax 1000 mg p.o.  ____________________________________________   FINAL CLINICAL IMPRESSION(S) / ED DIAGNOSES  Final diagnoses:  Dysuria  STI (sexually transmitted infection)     ED Discharge Orders    None       Note:  This document was prepared using Dragon voice recognition software and may include unintentional dictation errors.    [name],  L, PA-C 09/02/18 1201    [name], Mark, MD 09/02/18 [phone]

## 2018-09-02 NOTE — Discharge Instructions (Signed)
Follow-up with the [place] or [facility] for further testing.  Consider screening for HIV, hepatitis B and syphilis.  Today you are being treated for both gonorrhea and chlamydia since your urine looks very suspicious as it contained a lot of infection.  The injection and pills that you were given today complete your treatment for both gonorrhea and chlamydia.  You should contact your sex partner so that they may also be tested.

## 2018-09-02 NOTE — ED Triage Notes (Signed)
Pt to ED via POV, pt having burning and discharge from penis. Pt is in NAD

## 2018-09-04 ENCOUNTER — Telehealth: Payer: Self-pay | Admitting: Emergency Medicine

## 2018-09-04 NOTE — Telephone Encounter (Signed)
Called patient to inform of std results. He was treated for same during ED visit.  Left message asking him to call me.

## 2019-02-14 ENCOUNTER — Other Ambulatory Visit: Payer: Self-pay

## 2019-02-14 ENCOUNTER — Ambulatory Visit: Payer: Self-pay | Admitting: Family Medicine

## 2019-02-14 ENCOUNTER — Encounter: Payer: Self-pay | Admitting: Family Medicine

## 2019-02-14 DIAGNOSIS — Z113 Encounter for screening for infections with a predominantly sexual mode of transmission: Secondary | ICD-10-CM

## 2019-02-14 DIAGNOSIS — Z202 Contact with and (suspected) exposure to infections with a predominantly sexual mode of transmission: Secondary | ICD-10-CM

## 2019-02-14 LAB — GRAM STAIN

## 2019-02-14 MED ORDER — AZITHROMYCIN 500 MG PO TABS
1000.0000 mg | ORAL_TABLET | Freq: Once | ORAL | Status: AC
  Administered 2019-02-14: 1000 mg via ORAL

## 2019-02-14 NOTE — Progress Notes (Signed)
Gram stain reviewed and pt treated for NGU and contact to Chlamydia per standing order and per provider order. Provider orders completed.Ronny Bacon, RN

## 2019-02-14 NOTE — Progress Notes (Signed)
    STI clinic/screening visit  Subjective:  Peter Solomon is a 23 y.o. male being seen today for an STI screening visit. The patient reports they do not have symptoms.  Patient reports that they do not desire a pregnancy in the next year.   They reported they are not interested in discussing contraception today.   Patient has the following medical conditions:   Patient Active Problem List   Diagnosis Date Noted  . Mood disorder ([place]) 01/12/2014  . Broken ankle      Chief Complaint  Patient presents with  . SEXUALLY TRANSMITTED DISEASE    HPI  Patient reports his girlfriend tested + for chlamydia.  He is here for treatment and screening.  Client denies symptoms.  See flowsheet for further details and programmatic requirements.    The following portions of the patient's history were reviewed and updated as appropriate: allergies, current medications, past medical history, past social history, past surgical history and problem list.  Objective:  There were no vitals filed for this visit.  Physical Exam Constitutional:      Appearance: Normal appearance.  HENT:     Head: Normocephalic and atraumatic.     Comments: No nits or hair loss    Mouth/Throat:     Mouth: Mucous membranes are moist.     Pharynx: Oropharynx is clear. No oropharyngeal exudate or posterior oropharyngeal erythema.  Pulmonary:     Effort: Pulmonary effort is normal.  Abdominal:     General: Abdomen is flat.     Palpations: Abdomen is soft. There is no hepatomegaly or mass.     Tenderness: There is no abdominal tenderness.  Genitourinary:    Pubic Area: No rash or pubic lice.      Penis: Normal.      Scrotum/Testes: Normal.     Epididymis:     Right: Normal.     Left: Normal.     Rectum: Normal.  Lymphadenopathy:     Head:     Right side of head: No preauricular or posterior auricular adenopathy.     Left side of head: No preauricular or posterior auricular adenopathy.     Cervical: No  cervical adenopathy.     Upper Body:     Right upper body: No supraclavicular or axillary adenopathy.     Left upper body: No supraclavicular or axillary adenopathy.     Lower Body: No right inguinal adenopathy. No left inguinal adenopathy.  Skin:    General: Skin is warm and dry.     Findings: No rash.  Neurological:     Mental Status: He is alert and oriented to person, place, and time.       Assessment and Plan:  Peter Solomon is a 23 y.o. male presenting to the [facility] Department for STI screening  1. Screening examination for venereal disease  - Gram stain - Gonococcus culture - HIV [facility] - Syphilis Serology, [facility]  2. Chlamydia contact - azithromycin (ZITHROMAX) tablet 1,000 mg Co no sexual activity x 7 days after treatment. Condoms for STD prevention  No follow-ups on file.  No future appointments.  Hassell Done, FNP

## 2019-02-19 LAB — GONOCOCCUS CULTURE

## 2020-03-04 ENCOUNTER — Encounter (HOSPITAL_COMMUNITY): Payer: Self-pay | Admitting: Emergency Medicine

## 2020-03-04 ENCOUNTER — Ambulatory Visit (HOSPITAL_COMMUNITY)
Admission: EM | Admit: 2020-03-04 | Discharge: 2020-03-04 | Disposition: A | Discharge status: Home / Self Care | Payer: HRSA Program | Attending: Family Medicine | Admitting: Family Medicine

## 2020-03-04 ENCOUNTER — Other Ambulatory Visit: Payer: Self-pay

## 2020-03-04 DIAGNOSIS — R509 Fever, unspecified: Secondary | ICD-10-CM | POA: Diagnosis present

## 2020-03-04 DIAGNOSIS — Z886 Allergy status to analgesic agent status: Secondary | ICD-10-CM | POA: Diagnosis not present

## 2020-03-04 DIAGNOSIS — U071 COVID-19: Secondary | ICD-10-CM | POA: Insufficient documentation

## 2020-03-04 DIAGNOSIS — R52 Pain, unspecified: Secondary | ICD-10-CM

## 2020-03-04 DIAGNOSIS — A749 Chlamydial infection, unspecified: Secondary | ICD-10-CM | POA: Insufficient documentation

## 2020-03-04 DIAGNOSIS — R369 Urethral discharge, unspecified: Secondary | ICD-10-CM

## 2020-03-04 MED ORDER — DOXYCYCLINE HYCLATE 100 MG PO CAPS: 100.0000 mg | ORAL_CAPSULE | Freq: Two times a day (BID) | ORAL | 0 refills | Status: DC

## 2020-03-04 MED ORDER — CEFTRIAXONE SODIUM 500 MG IJ SOLR
500.0000 mg | Freq: Once | INTRAMUSCULAR | Status: AC
  Administered 2020-03-04: 500 mg via INTRAMUSCULAR

## 2020-03-04 MED ORDER — CEFTRIAXONE SODIUM 500 MG IJ SOLR
INTRAMUSCULAR | Status: AC
  Filled 2020-03-04: qty 500

## 2020-03-04 MED ORDER — LIDOCAINE HCL (PF) 1 % IJ SOLN
INTRAMUSCULAR | Status: AC
  Filled 2020-03-04: qty 2

## 2020-03-04 NOTE — Discharge Instructions (Signed)
You have been tested for COVID-19 today. If your test returns positive, you will receive a phone call from [facility] regarding your results. Negative test results are not called. Both positive and negative results area always visible on MyChart. If you do not have a MyChart account, sign up instructions are provided in your discharge papers. Please do not hesitate to contact us should you have questions or concerns.  You have been given the following today for treatment of suspected gonorrhea and/or chlamydia:  cefTRIAXone (ROCEPHIN) injection 500 mg  Please pick up your prescription for doxycycline 100 mg and begin taking twice daily for the next seven (7) days.  Even though we have treated you today, we have sent testing for sexually transmitted infections. We will notify you of any positive results once they are received. If required, we will prescribe any medications you might need.  Please refrain from all sexual activity for at least the next seven days.

## 2020-03-04 NOTE — ED Triage Notes (Signed)
Pt states that he has HA, body aches, chills, fever, and nasal congestion, Pt states that his sx started last night. Pt states he took Advil around 1pm. Pt states he is also having penile discharge (green/yellow) for about a week and would like an STD test

## 2020-03-04 NOTE — ED Provider Notes (Signed)
[facility]   [phone] 03/04/20 Arrival Time: 1307  ASSESSMENT & PLAN:  1. Fever, unspecified fever cause   2. Body aches   3. Penile discharge     Covid testing sent. Urethral cytology sent. Empiric tx as below..   Discharge Instructions     You have been tested for COVID-19 today. If your test returns positive, you will receive a phone call from [facility] regarding your results. Negative test results are not called. Both positive and negative results area always visible on MyChart. If you do not have a MyChart account, sign up instructions are provided in your discharge papers. Please do not hesitate to contact us should you have questions or concerns.  You have been given the following today for treatment of suspected gonorrhea and/or chlamydia:  cefTRIAXone (ROCEPHIN) injection 500 mg  Please pick up your prescription for doxycycline 100 mg and begin taking twice daily for the next seven (7) days.  Even though we have treated you today, we have sent testing for sexually transmitted infections. We will notify you of any positive results once they are received. If required, we will prescribe any medications you might need.  Please refrain from all sexual activity for at least the next seven days.     Pending: Labs Reviewed  SARS CORONAVIRUS 2 (TAT 6-24 HRS)  CYTOLOGY, (ORAL, ANAL, URETHRAL) ANCILLARY ONLY    Will notify of any positive results. Instructed to refrain from sexual activity for at least seven days.  Reviewed expectations re: course of current medical issues. Questions answered. Outlined signs and symptoms indicating need for more acute intervention. Patient verbalized understanding. After Visit Summary given.   SUBJECTIVE:  Peter Solomon is a 24 y.o. male who presents with complaint of penile discharge. Onset gradual. First noticed few d ago. Describes discharge as thick and opaque. No specific aggravating or alleviating factors  reported. Denies: urinary frequency, dysuria and gross hematuria. Afebrile. No abdominal or pelvic pain. No n/v. No rashes or lesions. Reports that he is sexually active with single male partner. OTC treatment: none. History of STI: none reported.  Also complains of body aches and chills and subj fever; abrupt onset yesterday. Around sick people recently.  OBJECTIVE:  Vitals:   03/04/20 1612  BP: 132/63  Pulse: 87  Resp: 18  Temp: 99.3 F (37.4 C)  TempSrc: Oral  SpO2: 99%     General appearance: alert, cooperative, appears stated age and no distress Throat: lips, mucosa, and tongue normal; teeth and gums normal Lungs: unlabored respirations; speaks full sentences without difficulty Back: no CVA tenderness; FROM at waist Abdomen: soft, non-tender GU: normal appearing genitalia Skin: warm and dry Psychological: alert and cooperative; normal mood and affect.    Labs Reviewed  CYTOLOGY, (ORAL, ANAL, URETHRAL) ANCILLARY ONLY    Allergies  Allergen Reactions  . Motrin [Ibuprofen] Nausea Only    Past Medical History:  Diagnosis Date  . Asthma   . Broken ankle    broken right ankle, 24yo  . Right knee pain 12/06/2011   Family History  Problem Relation Age of Onset  . Diabetes Father   . Hypertension Father   . Seizures Father   . Diabetes Other   . Hypertension Other   . Cancer Other    Social History   Socioeconomic History  . Marital status: Single    Spouse name: Not on file  . Number of children: Not on file  . Years of education: Not on file  .  Highest education level: Not on file  Occupational History  . Not on file  Tobacco Use  . Smoking status: Never Smoker  . Smokeless tobacco: Never Used  Substance and Sexual Activity  . Alcohol use: No  . Drug use: Yes    Frequency: 4.0 times per week    Types: Marijuana  . Sexual activity: Not on file  Other Topics Concern  . Not on file  Social History Narrative  . Not on file   Social  Determinants of Health   Financial Resource Strain: Not on file  Food Insecurity: Not on file  Transportation Needs: Not on file  Physical Activity: Not on file  Stress: Not on file  Social Connections: Not on file  Intimate Partner Violence: Not on file          Mardella Layman, MD 03/04/20 1626

## 2020-03-05 LAB — SARS CORONAVIRUS 2 (TAT 6-24 HRS): SARS Coronavirus 2: POSITIVE — AB

## 2020-03-05 LAB — CYTOLOGY, (ORAL, ANAL, URETHRAL) ANCILLARY ONLY
Chlamydia: POSITIVE — AB
Comment: NEGATIVE
Comment: NEGATIVE
Comment: NORMAL
Neisseria Gonorrhea: NEGATIVE
Trichomonas: NEGATIVE

## 2020-03-06 ENCOUNTER — Telehealth (HOSPITAL_COMMUNITY): Payer: Self-pay | Admitting: Emergency Medicine

## 2020-03-06 MED ORDER — DOXYCYCLINE HYCLATE 100 MG PO CAPS: 100.0000 mg | ORAL_CAPSULE | Freq: Two times a day (BID) | ORAL | 0 refills | Status: AC

## 2020-03-06 NOTE — Telephone Encounter (Signed)
Patient wanted prescription sent to another pharmacy
